# Patient Record
Sex: Male | Born: 1990 | Race: Black or African American | Marital: Single | Smoking: Never smoker
Health system: Southern US, Community
[De-identification: ages and names within clinical notes are randomized; demographics above are authoritative.]

---

## 2013-11-09 ENCOUNTER — Ambulatory Visit (INDEPENDENT_AMBULATORY_CARE_PROVIDER_SITE_OTHER): Payer: Self-pay | Admitting: Family Medicine

## 2013-11-09 VITALS — BP 112/60 | HR 74 | Temp 98.3°F | Resp 16 | Ht 71.0 in | Wt 173.4 lb

## 2013-11-09 DIAGNOSIS — L600 Ingrowing nail: Secondary | ICD-10-CM

## 2013-11-09 DIAGNOSIS — L089 Local infection of the skin and subcutaneous tissue, unspecified: Secondary | ICD-10-CM

## 2013-11-09 MED ORDER — DOXYCYCLINE HYCLATE 100 MG PO TABS: 100.0000 mg | ORAL_TABLET | Freq: Two times a day (BID) | ORAL | Status: DC

## 2013-11-09 NOTE — Progress Notes (Signed)
PROCEDURE: Verbal consent obtained. Digital block with 4 cc 2% lidocaine plain.  SP&D.  Lateral nail lifted and removed.  Xeroform placed.  Cleansed and dressed.

## 2013-11-09 NOTE — Progress Notes (Signed)
      Chief Complaint:  Chief Complaint  Patient presents with  . Foot Injury    Right foot, pt. was playing soccer and injured big toe.     HPI: Chase Pierce is a 23 y.o. male who is here for  1 month hx of right great toe  Infection, Playing soccer and kicked the ball and stubbed his great toe, he has had problems with the nail and it looks infected. No pain with walking, no fevers or chills.   History reviewed. No pertinent past medical history. History reviewed. No pertinent past surgical history. History   Social History  . Marital Status: Single    Spouse Name: N/A    Number of Children: N/A  . Years of Education: N/A   Social History Main Topics  . Smoking status: Never Smoker   . Smokeless tobacco: None  . Alcohol Use: No  . Drug Use: No  . Sexual Activity: None   Other Topics Concern  . None   Social History Narrative  . None   No family history on file. No Known Allergies Prior to Admission medications   Not on File     ROS: The patient denies fevers, chills, night sweats, unintentional weight loss, chest pain, palpitations, wheezing, dyspnea on exertion, nausea, vomiting, abdominal pain, dysuria, hematuria, melena, numbness, weakness, or tingling.   All other systems have been reviewed and were otherwise negative with the exception of those mentioned in the HPI and as above.    PHYSICAL EXAM: Filed Vitals:   11/09/13 1542  BP: 112/60  Pulse: 74  Temp: 98.3 F (36.8 C)  Resp: 16   Filed Vitals:   11/09/13 1542  Height:  (1.803 m)  Weight: 173 lb 6.4 oz (78.654 kg)   Body mass index is 24.2 kg/(m^2).  General: Alert, no acute distress HEENT:  Normocephalic, atraumatic, oropharynx patent. EOMI, PERRLA Cardiovascular:  Regular rate and rhythm, no rubs murmurs or gallops.  No Carotid bruits, radial pulse intact. No pedal edema.  Respiratory: Clear to auscultation bilaterally.  No wheezes, rales, or rhonchi.  No cyanosis, no use of  accessory musculature GI: No organomegaly, abdomen is soft and non-tender, positive bowel sounds.  No masses. Skin: + right ingrown toe nail with infection Neurologic: Facial musculature symmetric. Psychiatric: Patient is appropriate throughout our interaction. Lymphatic: No cervical lymphadenopathy Musculoskeletal: Gait intact.   LABS: No results found for this or any previous visit.   EKG/XRAY:   Primary read interpreted by Dr. Conley Rolls at [facility].   ASSESSMENT/PLAN: Encounter Diagnoses  Name Primary?  . Ingrown right big toenail Yes  . Toe infection    In grown toe nail removal Rx Doxycycline 100 mg BID x 10 days Wound care as directed, f/u prn  Gross sideeffects, risk and benefits, and alternatives of medications d/w patient. Patient is aware that all medications have potential sideeffects and we are unable to predict every sideeffect or drug-drug interaction that may occur.  Hamilton Capri PHUONG, DO 11/12/2013 6:53 PM

## 2013-11-09 NOTE — Patient Instructions (Addendum)
INGROWN TOENAIL . Keep area clean, dry and bandaged for 24 hours. . After 24 hours, remove outer bandage and leave yellow gauze in place. . Soak toe/foot in warm soapy water for 5-10 minutes, once daily for 5 days. Rebandage toe after each cleaning. . Continue soaks until yellow gauze falls off. . Notify the office if you experience any of the following signs of infection: Swelling, redness, pus drainage, streaking, fever > 101.0 F   

## 2014-09-07 ENCOUNTER — Ambulatory Visit (INDEPENDENT_AMBULATORY_CARE_PROVIDER_SITE_OTHER): Payer: BLUE CROSS/BLUE SHIELD | Admitting: Internal Medicine

## 2014-09-07 VITALS — BP 114/68 | HR 64 | Temp 98.6°F | Resp 16 | Ht 71.0 in | Wt 168.0 lb

## 2014-09-07 DIAGNOSIS — L600 Ingrowing nail: Secondary | ICD-10-CM | POA: Diagnosis not present

## 2014-09-07 NOTE — Progress Notes (Signed)
   Subjective:  This chart was scribed for Chase Siaobert Jeramine Delis, MD by Stann Oresung-Kai Tsai, Medical Scribe. This patient was seen in Room 7 and the patient's care was started at 11:56 AM.     Patient ID: Chase Pierce, male    DOB: 07/22/1990, 24 y.o.   MRN: [id number]  HPI Chase Pierce is a 24 y.o. male who [facility] complaining of right big toe pain. Last time he was here in the office, he had an ingrown toenail and infection- he had his nail cut out  3 months ago. He is upset that it has returned.  There are no active problems to display for this patient.  He is on no medications     Review of Systems  Constitutional: Negative for fever, chills, diaphoresis and fatigue.  HENT: Negative for congestion, rhinorrhea, sneezing and sore throat.   Respiratory: Negative for cough and shortness of breath.   Gastrointestinal: Negative for nausea, vomiting, diarrhea and constipation.  Musculoskeletal: Negative for gait problem.       Objective:   Physical Exam  Constitutional: He is oriented to person, place, and time. He appears well-developed and well-nourished. No distress.  HENT:  Head: Normocephalic and atraumatic.  Eyes: EOM are normal. Pupils are equal, round, and reactive to light.  Neck: Neck supple.  Cardiovascular: Normal rate.   Pulmonary/Chest: Effort normal. No respiratory distress.  Musculoskeletal: Normal range of motion.  Neurological: He is alert and oriented to person, place, and time.  Skin: Skin is warm and dry.  His right great toe has an ingrown area with secondary infection on the lateral aspect of the nailbed  Psychiatric: He has a normal mood and affect. His behavior is normal.  Nursing note and vitals reviewed.         Assessment & Plan:  Problem #1 ingrown toenail with infection  We discussed wound care and the appropriate way to prevent this nail from the coming embedded again. Attention to thorough cleaning over the next week will prevent the  need for antibiotics.  I have completed the patient encounter in its entirety as documented by the scribe, with editing by me where necessary. Jaren Kearn P. Merla Richesoolittle, M.D.

## 2014-09-07 NOTE — Patient Instructions (Signed)
INGROWN TOENAIL . Keep area clean, dry and bandaged for 24 hours. . After 24 hours, remove outer bandage and leave yellow gauze in place. . Soak toe/foot in warm soapy water for 5-10 minutes, once daily for 5 days. Rebandage toe after each cleaning. . Continue soaks until yellow gauze falls off. . Notify the office if you experience any of the following signs of infection: Swelling, redness, pus drainage, streaking, fever > 101.0 F   

## 2014-09-07 NOTE — Progress Notes (Signed)
Procedure:  Consent obtained.  2% lido mixed 50:50 with marcaine used for digital block.  Local anesthesia obtained.  Betadine prep and drape.  Medial wedge resection performed.  Xeroform gauze placed on nailbed.  Drsg placed.

## 2014-09-08 ENCOUNTER — Telehealth: Payer: Self-pay | Admitting: Family Medicine

## 2014-09-08 MED ORDER — DOXYCYCLINE HYCLATE 100 MG PO TABS: 100.0000 mg | ORAL_TABLET | Freq: Two times a day (BID) | ORAL | Status: DC

## 2014-09-08 NOTE — Addendum Note (Signed)
Addended by: Tonye PearsonOLITTLE, Vedanshi Massaro P on: 09/08/2014 05:09 PM   Modules accepted: Orders

## 2014-09-08 NOTE — Telephone Encounter (Signed)
Dr. Merla Richesoolittle sent in Doxy tried calling patient to let him know but number is not in service

## 2014-09-09 NOTE — Telephone Encounter (Signed)
Number is incorrect. I called the pharmacy to see if he picked up Rx. He did not but they have an updated number for him. The number is  [phone] but n o vm to leave message.

## 2014-09-11 NOTE — Telephone Encounter (Signed)
Called relative on the emergency contact. He gave me a new number for pt [phone]. He states he picked up the Rx already.

## 2016-09-15 ENCOUNTER — Encounter: Payer: Self-pay | Admitting: Family Medicine

## 2016-09-15 ENCOUNTER — Ambulatory Visit (INDEPENDENT_AMBULATORY_CARE_PROVIDER_SITE_OTHER): Payer: BLUE CROSS/BLUE SHIELD

## 2016-09-15 ENCOUNTER — Ambulatory Visit (INDEPENDENT_AMBULATORY_CARE_PROVIDER_SITE_OTHER): Payer: BLUE CROSS/BLUE SHIELD | Admitting: Family Medicine

## 2016-09-15 VITALS — BP 125/75 | HR 64 | Temp 97.9°F | Resp 18 | Ht 71.0 in | Wt 177.4 lb

## 2016-09-15 DIAGNOSIS — M545 Low back pain: Secondary | ICD-10-CM | POA: Diagnosis not present

## 2016-09-15 DIAGNOSIS — G8929 Other chronic pain: Secondary | ICD-10-CM

## 2016-09-15 DIAGNOSIS — W132XXS Fall from, out of or through roof, sequela: Secondary | ICD-10-CM

## 2016-09-15 NOTE — Progress Notes (Signed)
  Chief Complaint  Patient presents with  . Back Pain    intermittent pain since falling from roof 10 years ago, unable to tell me when pain started up again, not taking any otc medications for pain.  Per patient pain level is 5/10    HPI   Pt reports that he has been having back pain for the past 2 months that is midline and low back The pain is nonradiating The pain is like a kneading squeezing pain States that he has not been taking medication for the pain He came in today to make sure nothing was wrong overall He is not limping He is urinating okay No groin numbness  He fell off a roof about 10 years ago and reports that he never got evaluated He states that since that time   No past medical history on file.  Current Outpatient Prescriptions  Medication Sig Dispense Refill  . doxycycline (VIBRA-TABS) 100 MG tablet Take 1 tablet (100 mg total) by mouth 2 (two) times daily. (Patient not taking: Reported on 09/15/2016) 14 tablet 0   No current facility-administered medications for this visit.     Allergies: No Known Allergies  No past surgical history on file.  Social History   Social History  . Marital status: Single    Spouse name: N/A  . Number of children: N/A  . Years of education: N/A   Social History Main Topics  . Smoking status: Never Smoker  . Smokeless tobacco: Never Used  . Alcohol use No  . Drug use: No  . Sexual activity: Not Asked   Other Topics Concern  . None   Social History Narrative  . None    ROS  Objective: Vitals:   09/15/16 1422  BP: 125/75  Pulse: 64  Resp: 18  Temp: 97.9 F (36.6 C)  TempSrc: Oral  SpO2: 100%  Weight: 177 lb 6.4 oz (80.5 kg)  Height: 5\' 11"  (1.803 m)    Physical Exam  Constitutional: He appears well-developed and well-nourished.  HENT:  Head: Normocephalic and atraumatic.  Eyes: Conjunctivae and EOM are normal.  Cardiovascular: Normal rate, regular rhythm and normal heart sounds.     Pulmonary/Chest: Effort normal and breath sounds normal. No respiratory distress. He has no wheezes.  Musculoskeletal:       Lumbar back: He exhibits tenderness. He exhibits normal range of motion, no bony tenderness, no swelling, no edema, no deformity, no laceration, no pain, no spasm and normal pulse.       Back:   XRAY NEGATIVE  Assessment and Plan Audley was seen today for back pain.  Diagnoses and all orders for this visit:  Chronic midline low back pain without sciatica -     DG Lumbar Spine Complete  Fall from roof, sequela -     DG Lumbar Spine Complete  Advised pt to treat his pain with topical aspercreme and ibuprofen prn    Denys Salinger A Tiyanna Larcom

## 2016-09-15 NOTE — Patient Instructions (Signed)
     IF you received an x-ray today, you will receive an invoice from [facility]. Please contact [facility] at [phone] with questions or concerns regarding your invoice.   IF you received labwork today, you will receive an invoice from [facility]. Please contact [facility] at [phone] with questions or concerns regarding your invoice.   Our billing staff will not be able to assist you with questions regarding bills from these companies.  You will be contacted with the lab results as soon as they are available. The fastest way to get your results is to activate your My Chart account. Instructions are located on the last page of this paperwork. If you have not heard from us regarding the results in 2 weeks, please contact this office.     

## 2016-09-22 ENCOUNTER — Encounter: Payer: Self-pay | Admitting: Physician Assistant

## 2016-09-22 ENCOUNTER — Ambulatory Visit (INDEPENDENT_AMBULATORY_CARE_PROVIDER_SITE_OTHER): Payer: BLUE CROSS/BLUE SHIELD | Admitting: Physician Assistant

## 2016-09-22 VITALS — BP 114/74 | HR 59 | Temp 97.8°F | Resp 16 | Ht 71.0 in | Wt 177.6 lb

## 2016-09-22 DIAGNOSIS — S29011A Strain of muscle and tendon of front wall of thorax, initial encounter: Secondary | ICD-10-CM

## 2016-09-22 NOTE — Patient Instructions (Addendum)
Rest and ice can be helpful here.  Take tylenol 1000 mg every eight hours as needed for pain.     IF you received an x-ray today, you will receive an invoice from [facility]. Please contact [facility] at [phone] with questions or concerns regarding your invoice.   IF you received labwork today, you will receive an invoice from [facility]. Please contact [facility] at [phone] with questions or concerns regarding your invoice.   Our billing staff will not be able to assist you with questions regarding bills from these companies.  You will be contacted with the lab results as soon as they are available. The fastest way to get your results is to activate your My Chart account. Instructions are located on the last page of this paperwork. If you have not heard from us regarding the results in 2 weeks, please contact this office.

## 2016-09-22 NOTE — Progress Notes (Signed)
    09/22/2016 3:32 PM   DOB: 08/10/1990 / MRN: [id number]  SUBJECTIVE:  Chase Pierce is a 26 y.o. male presenting for muscular pain that started after a fall 3 days ago.  Denies swelling.  Has generalized pain about the left upper chest and left posterior.  He has not tried any medication. He is no worse or better. Denies any history of dislocation.    He has No Known Allergies.   He  has no past medical history on file.    He  reports that he has never smoked. He has never used smokeless tobacco. He reports that he does not drink alcohol or use drugs. He  has no sexual activity history on file. The patient  has no past surgical history on file.  His family history is not on file.  Review of Systems  Constitutional: Negative for fever.  Musculoskeletal: Positive for falls and joint pain. Negative for back pain, myalgias and neck pain.  Skin: Negative for rash.  Neurological: Negative for dizziness, tingling and focal weakness.    The problem list and medications were reviewed and updated by myself where necessary and exist elsewhere in the encounter.   OBJECTIVE:  BP 114/74 (BP Location: Right Arm, Patient Position: Sitting, Cuff Size: Normal)   Pulse (!) 59   Temp 97.8 F (36.6 C) (Oral)   Resp 16   Ht 5\' 11"  (1.803 m)   Wt 177 lb 9.6 oz (80.6 kg)   SpO2 100%   BMI 24.77 kg/m   Physical Exam  Constitutional: He is oriented to person, place, and time. He appears well-developed. He is active and cooperative.  Non-toxic appearance.  Cardiovascular: Normal rate, regular rhythm, S1 normal, S2 normal, normal heart sounds, intact distal pulses and normal pulses.  Exam reveals no gallop and no friction rub.   No murmur heard. Pulmonary/Chest: Effort normal. No stridor. No tachypnea. No respiratory distress. He has no wheezes. He has no rales.  Abdominal: He exhibits no distension.  Musculoskeletal: He exhibits no edema.       Arms: Neurological: He is alert and oriented to  person, place, and time. He has normal reflexes. He displays normal reflexes. No cranial nerve deficit. He exhibits normal muscle tone. Coordination normal.  Skin: Skin is warm and dry. He is not diaphoretic. No pallor.  Psychiatric: He has a normal mood and affect. His speech is normal and behavior is normal. Judgment and thought content normal. Cognition and memory are normal.  Vitals reviewed.   No results found for this or any previous visit (from the past 72 hour(s)).  No results found.  ASSESSMENT AND PLAN:  Chase Pierce was seen today for shoulder pain.  Diagnoses and all orders for this visit:  Muscle strain of chest wall, initial encounter: Vital signs normal. He is quite tender about the upper portion of the left pectoralis.  Heart and lung exam normal.  No SOB, DOE, cough, diaphoresis, nausea. He does not want medication. Advise that he can take 1000 mg of tylenol q8 safely and he may try this.    The patient is advised to call or return to clinic if he does not see an improvement in symptoms, or to seek the care of the closest emergency department if he worsens with the above plan.   Deliah BostonMichael Ercel Normoyle, MHS, PA-C [facility] at [facility] 09/22/2016 3:32 PM

## 2017-02-10 DIAGNOSIS — K090 Developmental odontogenic cysts: Secondary | ICD-10-CM | POA: Diagnosis not present

## 2017-02-25 DIAGNOSIS — K090 Developmental odontogenic cysts: Secondary | ICD-10-CM | POA: Diagnosis not present

## 2018-04-18 IMAGING — DX DG LUMBAR SPINE COMPLETE 4+V
5 series · 5 of 5 positions shown · non-contrast
Comparison: None.

CLINICAL DATA: Low back pain.  Fall from roof 10 years ago

EXAM:
LUMBAR SPINE - COMPLETE 4+ VIEW

[l-spine ap]
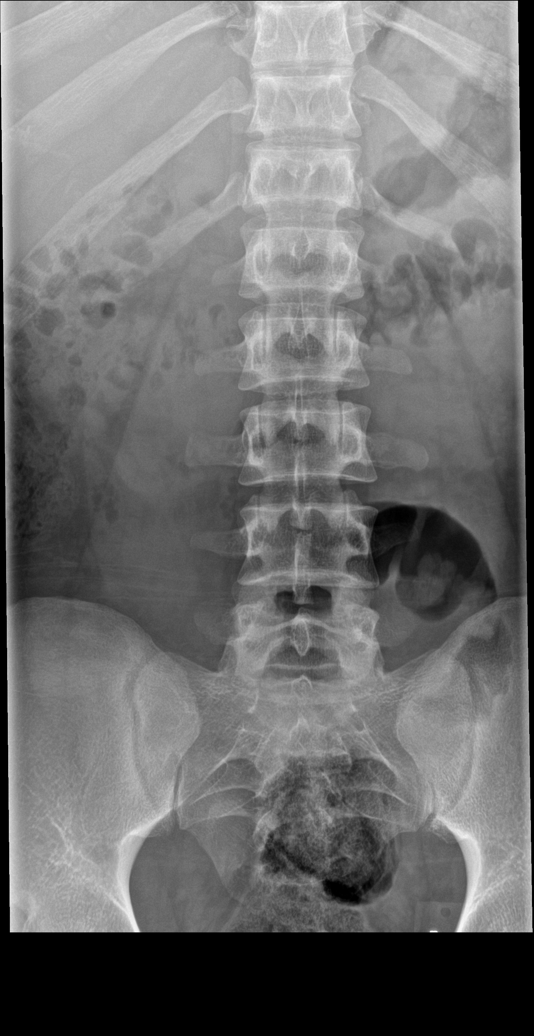

[l-spine obl (1 of 2)]
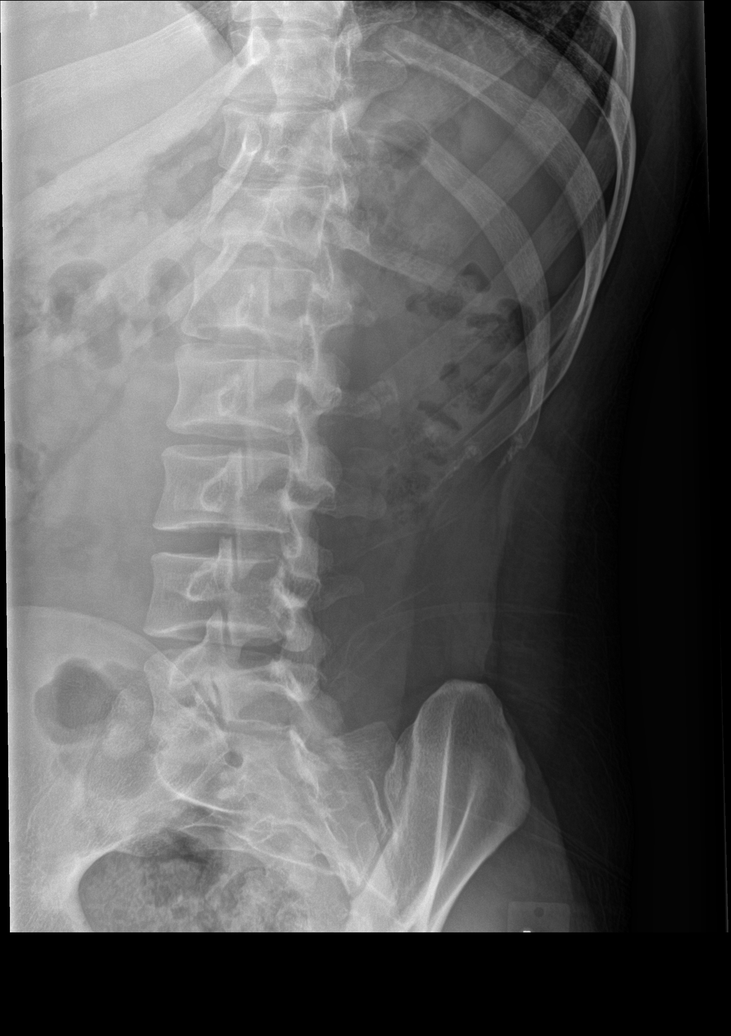

[l-spine obl (2 of 2)]
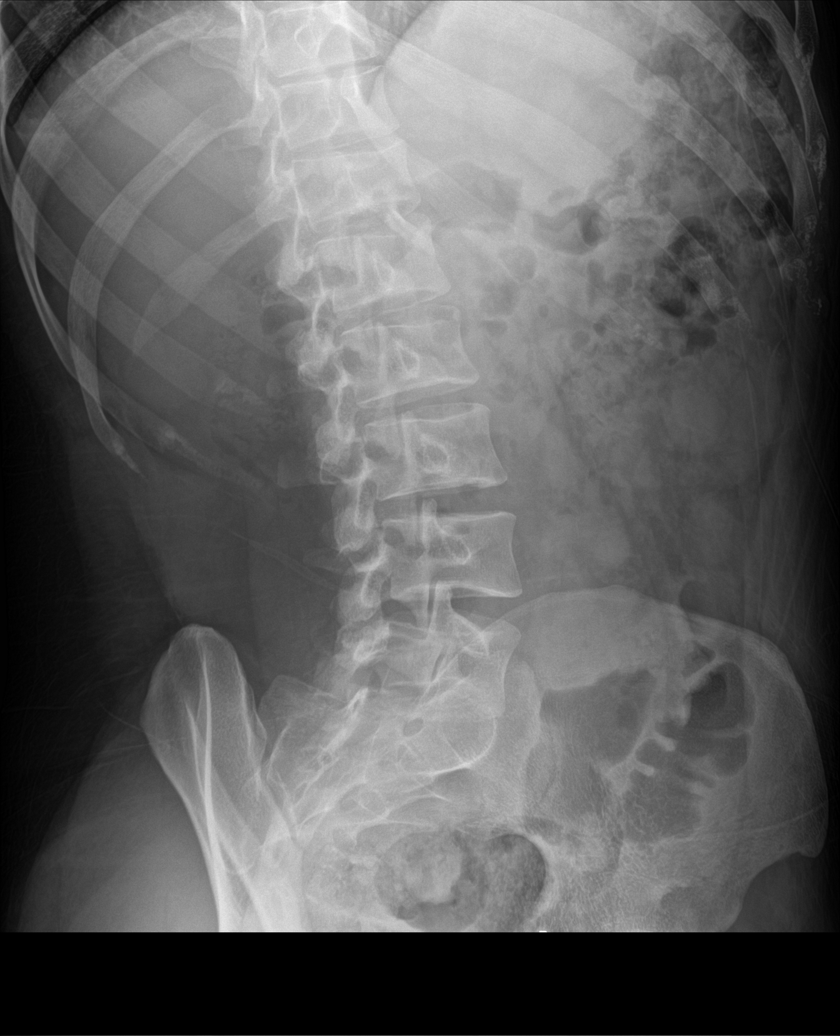

[l-spine lat]
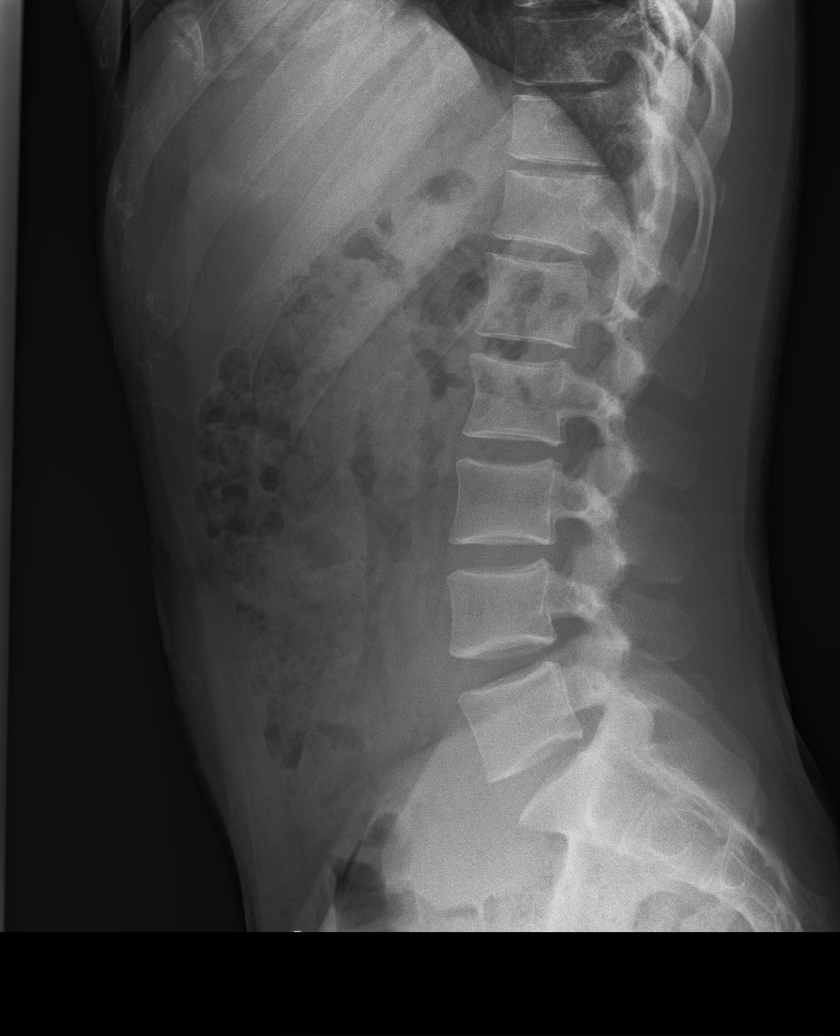

[l-spine l5-s1]
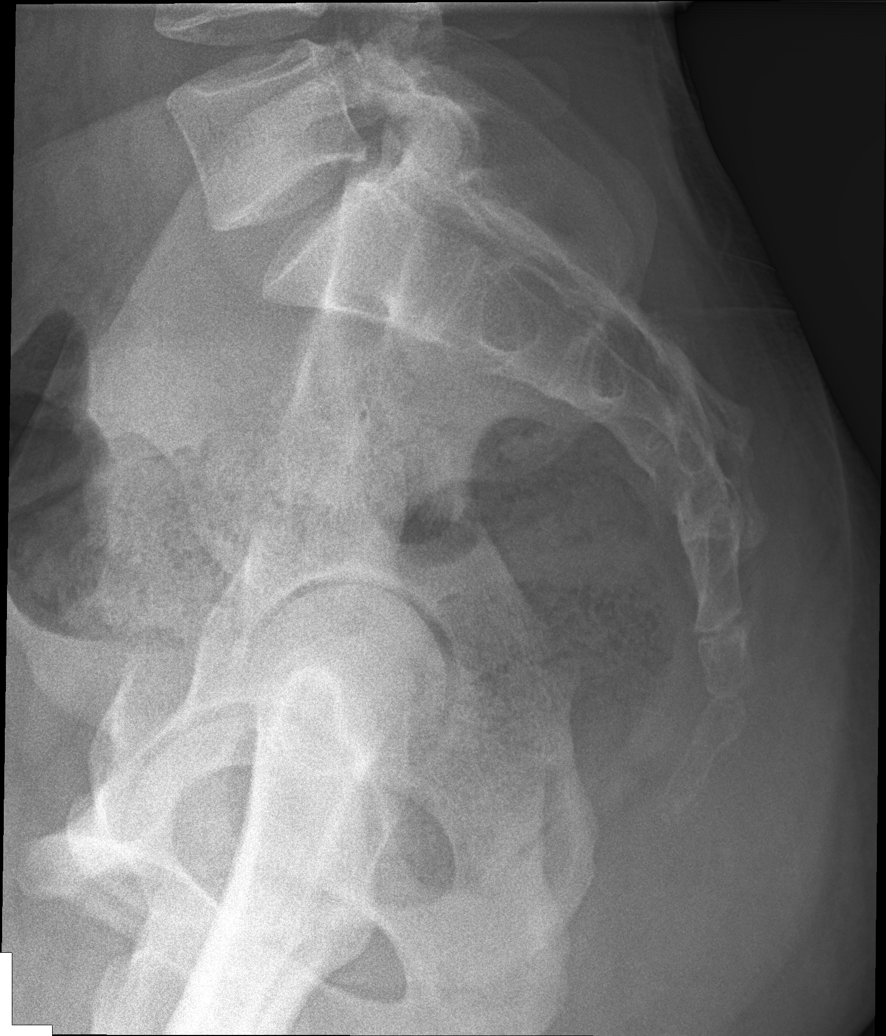

[5 of 5 positions shown; findings below may reference images not displayed]

FINDINGS: There is no evidence of lumbar spine fracture. Alignment is normal.
Intervertebral disc spaces are maintained.
IMPRESSION: Negative.
